# Patient Record
Sex: Female | Born: 1990 | Race: White | Hispanic: Yes | Marital: Single
Health system: Southern US, Community
[De-identification: ages and names within clinical notes are randomized; demographics above are authoritative.]

---
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
## 2012-04-04 IMAGING — US US OB DETAIL+14 WK
1 series · 14 of 28 positions shown · non-contrast
Comparison: none

[Series 1: us ob detail+14 wk · 14 of 55 slices shown]
[im 3/55]
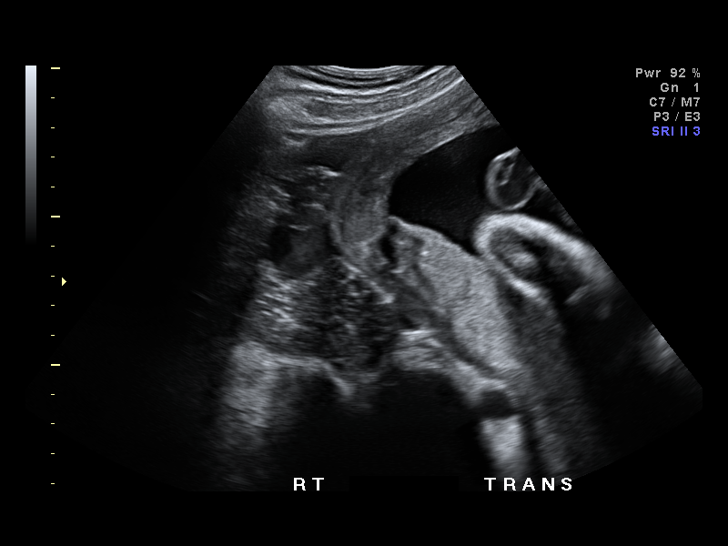
[im 7/55]
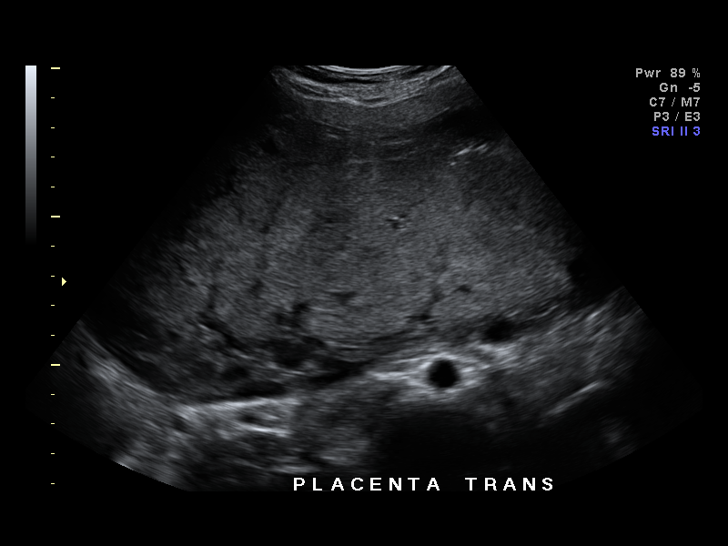
[im 11/55]
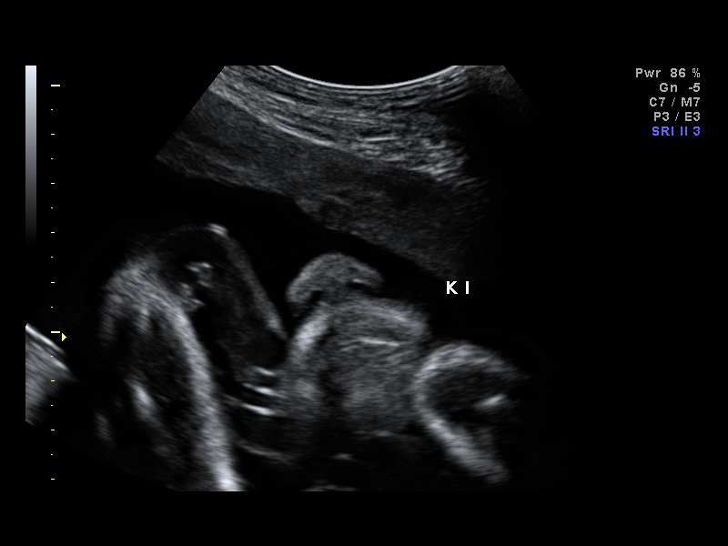
[im 15/55]
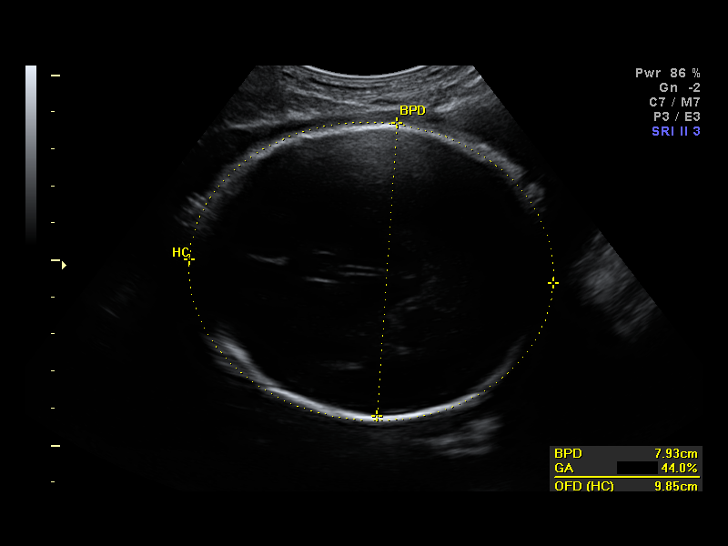
[im 19/55]
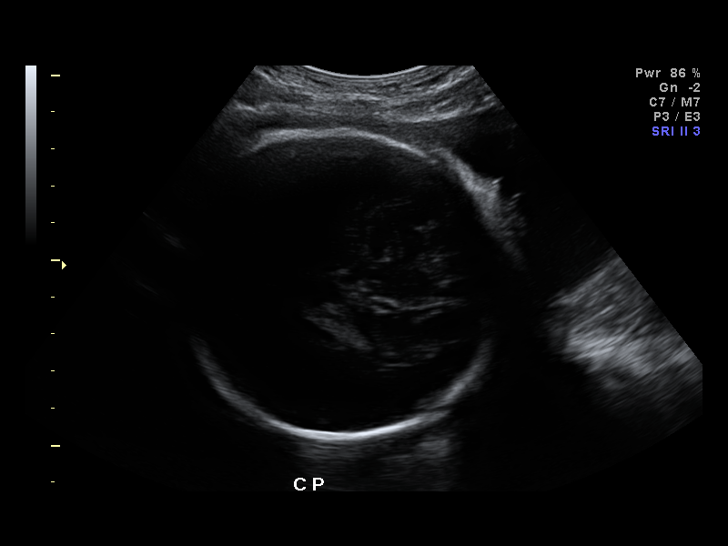
[im 23/55]
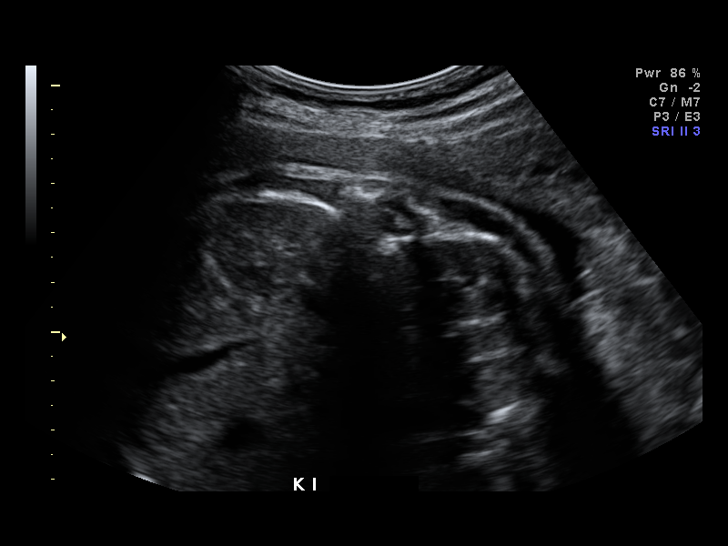
[im 27/55]
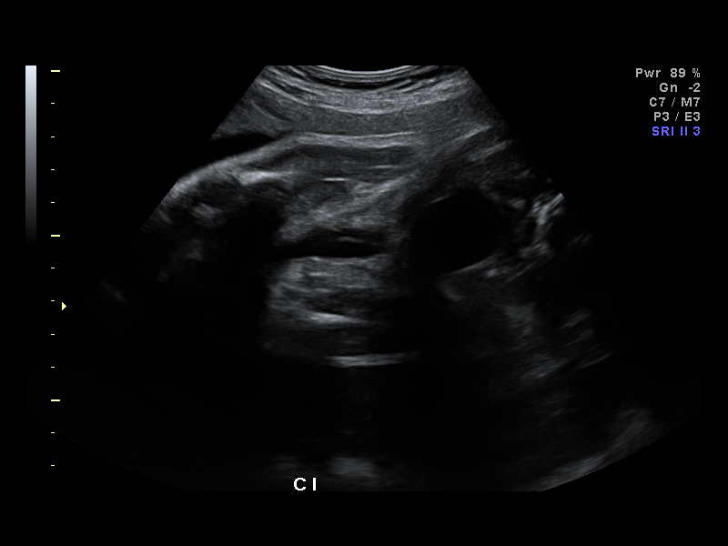
[im 31/55]
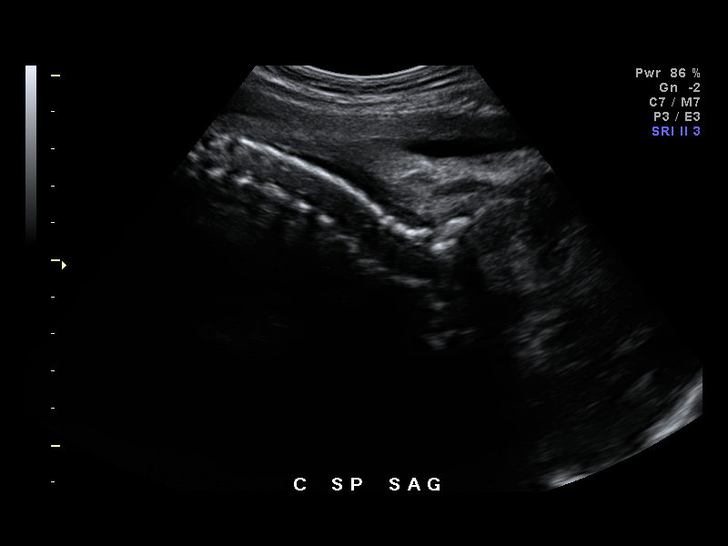
[im 35/55]
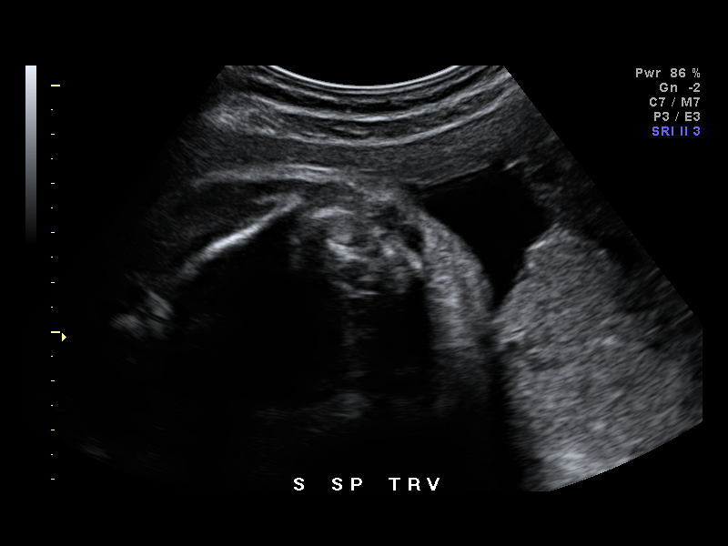
[im 39/55]
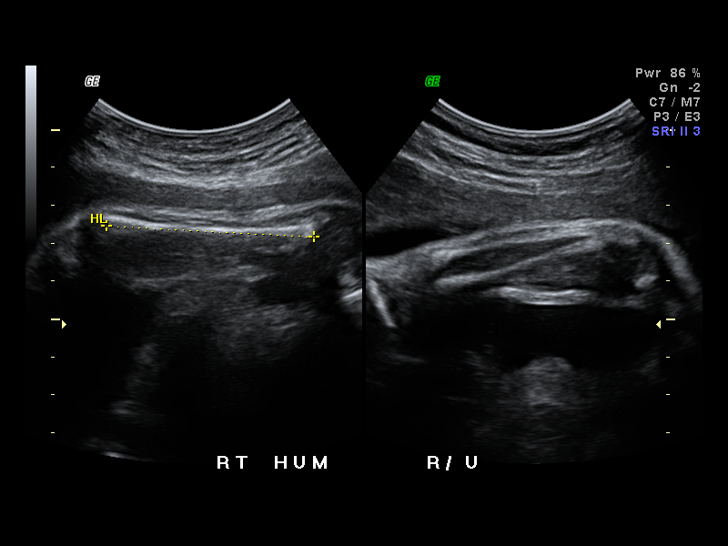
[im 43/55]
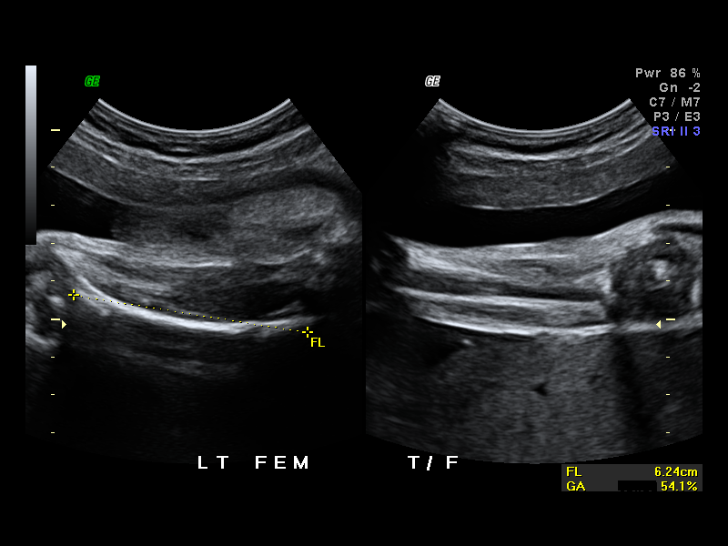
[im 47/55]
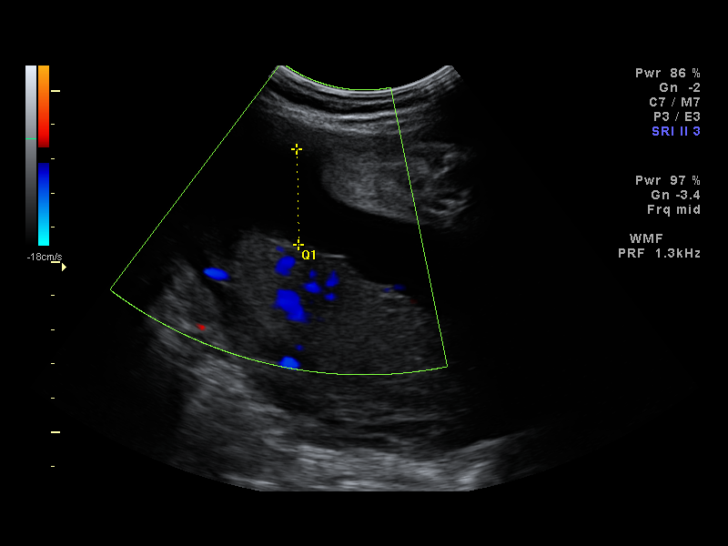
[im 51/55]
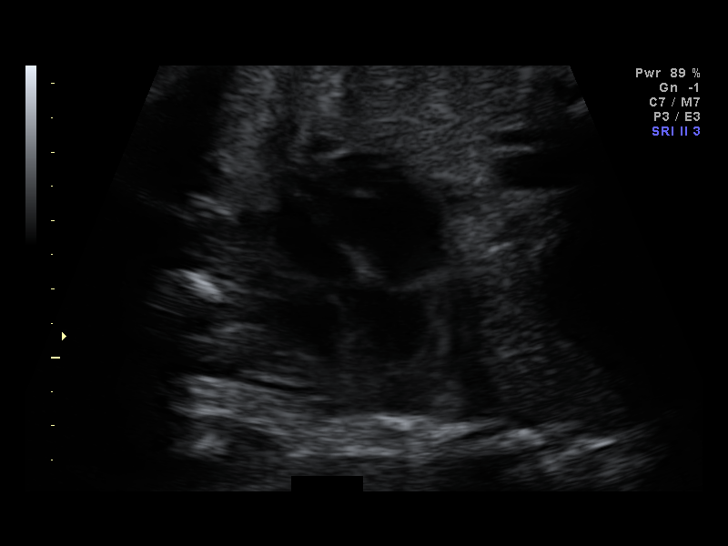
[im 55/55]
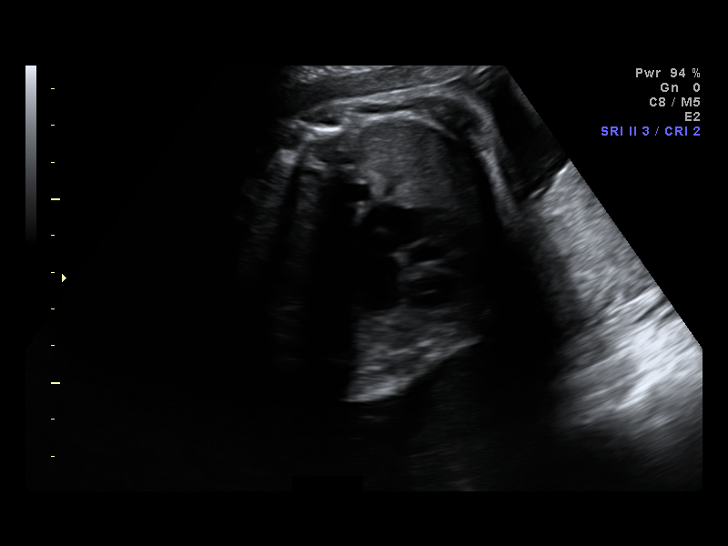

[14 of 28 positions shown; findings below may reference images not displayed]

Canned report from images found in remote index.

Refer to host system for actual result text.

## 2020-04-30 ENCOUNTER — Encounter (HOSPITAL_COMMUNITY): Payer: Self-pay | Admitting: Emergency Medicine

## 2020-04-30 ENCOUNTER — Emergency Department (HOSPITAL_COMMUNITY): Payer: Self-pay

## 2020-04-30 ENCOUNTER — Emergency Department (HOSPITAL_COMMUNITY)
Admission: EM | Admit: 2020-04-30 | Discharge: 2020-04-30 | Disposition: A | Discharge status: Home / Self Care | Payer: Self-pay | Attending: Emergency Medicine | Admitting: Emergency Medicine

## 2020-04-30 ENCOUNTER — Other Ambulatory Visit: Payer: Self-pay

## 2020-04-30 DIAGNOSIS — N83201 Unspecified ovarian cyst, right side: Secondary | ICD-10-CM | POA: Insufficient documentation

## 2020-04-30 DIAGNOSIS — N800 Endometriosis of uterus: Secondary | ICD-10-CM | POA: Insufficient documentation

## 2020-04-30 DIAGNOSIS — N8003 Adenomyosis of the uterus: Secondary | ICD-10-CM

## 2020-04-30 DIAGNOSIS — D270 Benign neoplasm of right ovary: Secondary | ICD-10-CM

## 2020-04-30 DIAGNOSIS — R102 Pelvic and perineal pain: Secondary | ICD-10-CM

## 2020-04-30 LAB — CBC
HCT: 32.8 % — ABNORMAL LOW (ref 36.0–46.0)
Hemoglobin: 10.5 g/dL — ABNORMAL LOW (ref 12.0–15.0)
MCH: 28.9 pg (ref 26.0–34.0)
MCHC: 32 g/dL (ref 30.0–36.0)
MCV: 90.4 fL (ref 80.0–100.0)
Platelets: 203 10*3/uL (ref 150–400)
RBC: 3.63 MIL/uL — ABNORMAL LOW (ref 3.87–5.11)
RDW: 13.2 % (ref 11.5–15.5)
WBC: 6.6 10*3/uL (ref 4.0–10.5)
nRBC: 0 % (ref 0.0–0.2)

## 2020-04-30 LAB — I-STAT BETA HCG BLOOD, ED (MC, WL, AP ONLY): I-stat hCG, quantitative: <5 m[IU]/mL (ref <5)

## 2020-04-30 LAB — WET PREP, GENITAL
Clue Cells Wet Prep HPF POC: NONE SEEN
Sperm: NONE SEEN
Trich, Wet Prep: NONE SEEN
Yeast Wet Prep HPF POC: NONE SEEN

## 2020-04-30 LAB — URINALYSIS, ROUTINE W REFLEX MICROSCOPIC
Bilirubin Urine: NEGATIVE
Glucose, UA: NEGATIVE mg/dL
Ketones, ur: NEGATIVE mg/dL
Leukocytes,Ua: NEGATIVE
Nitrite: NEGATIVE
Protein, ur: NEGATIVE mg/dL
Specific Gravity, Urine: 1.019 (ref 1.005–1.030)
pH: 6 (ref 5.0–8.0)

## 2020-04-30 LAB — COMPREHENSIVE METABOLIC PANEL
ALT: 11 U/L (ref 0–44)
AST: 22 U/L (ref 15–41)
Albumin: 3.8 g/dL (ref 3.5–5.0)
Alkaline Phosphatase: 34 U/L — ABNORMAL LOW (ref 38–126)
Anion gap: 10 (ref 5–15)
BUN: 13 mg/dL (ref 6–20)
CO2: 22 mmol/L (ref 22–32)
Calcium: 9 mg/dL (ref 8.9–10.3)
Chloride: 105 mmol/L (ref 98–111)
Creatinine, Ser: 0.79 mg/dL (ref 0.44–1.00)
GFR, Estimated: >60 mL/min (ref >60)
Glucose, Bld: 112 mg/dL — ABNORMAL HIGH (ref 70–99)
Potassium: 3.8 mmol/L (ref 3.5–5.1)
Sodium: 137 mmol/L (ref 135–145)
Total Bilirubin: 0.4 mg/dL (ref 0.3–1.2)
Total Protein: 7 g/dL (ref 6.5–8.1)

## 2020-04-30 LAB — LIPASE, BLOOD: Lipase: 27 U/L (ref 11–51)

## 2020-04-30 MED ORDER — HYDROCODONE-ACETAMINOPHEN 5-325 MG PO TABS: 2.0000 | ORAL_TABLET | ORAL | 0 refills | Status: AC | PRN

## 2020-04-30 MED ORDER — MORPHINE SULFATE (PF) 2 MG/ML IV SOLN
2.0000 mg | Freq: Once | INTRAVENOUS | Status: AC
  Administered 2020-04-30: 2 mg via INTRAVENOUS
  Filled 2020-04-30: qty 1

## 2020-04-30 MED ORDER — MORPHINE SULFATE (PF) 4 MG/ML IV SOLN: 4.0000 mg | Freq: Once | INTRAVENOUS | Status: DC

## 2020-04-30 NOTE — ED Provider Notes (Signed)
[facility] EM[facility] DEPARTMENT Provider Note   CSN: [id number] Arrival date & time: 04/30/20  1818     History Chief Complaint  Patient presents with  . Abdominal Pain    [name] is a 29 y.o. female.  HPI 29 year-old female with no significant medical history resents to the ER with complaints of suprapubic abdominal pain which started this morning.  Patient states that she started her menstrual cycle, sharp suprapubic pain which has gotten worse throughout the day.  She has taken a regular dose of ibuprofen with relief.  Feels like maybe her body is "rejecting ibuprofen" she does not take much medicine.  She states that she has had pain with laying down and even walking.  She denies any nausea or vomiting.  No fevers or chills.  She is currently on her menstrual cycle and has bleeding.  She states that this is happened once before, and she had a miscarriage.  She is sexually active with one partner and they do not use the barrier method.  She is not on any birth control.  She denies any flank pain.  No dysuria.  She does still have her appendix.  Has felt nauseous but no vomiting, has had some loose stools but no diarrhea.      History reviewed. No pertinent past medical history.  There are no problems to display for this patient.   History reviewed. No pertinent surgical history.   OB History   No obstetric history on file.     History reviewed. No pertinent family history.     Home Medications Prior to Admission medications   Medication Sig Start Date End Date Taking? Authorizing Provider  ibuprofen (ADVIL) 800 MG tablet Take 200 mg by mouth every 6 (six) hours as needed for headache or moderate pain.   Yes [provider]  Multiple Vitamins-Minerals (HAIR SKIN AND NAILS FORMULA PO) Take 1 tablet by mouth daily.   Yes [provider]  Omega-3 1000 MG CAPS Take 1,000 mg by mouth daily.   Yes [provider]  OVER THE  COUNTER MEDICATION Take 2 tablets by mouth daily. Vitamin d, zinc and magnesium   Yes [provider]  HYDROcodone-acetaminophen (NORCO/VICODIN) 5-325 MG tablet Take 2 tablets by mouth every 4 (four) hours as needed for up to 3 days. 04/30/20 05/03/20  Garald Balding, PA-C    Allergies    Patient has no known allergies.  Review of Systems   Review of Systems  Constitutional: Negative for chills and fever.  HENT: Negative for ear pain and sore throat.   Eyes: Negative for pain and visual disturbance.  Respiratory: Negative for cough and shortness of breath.   Cardiovascular: Negative for chest pain and palpitations.  Gastrointestinal: Positive for abdominal pain. Negative for vomiting.  Genitourinary: Positive for pelvic pain and vaginal bleeding. Negative for decreased urine volume, dysuria and hematuria.  Musculoskeletal: Negative for arthralgias and back pain.  Skin: Negative for color change and rash.  Neurological: Negative for seizures and syncope.  All other systems reviewed and are negative.   Physical Exam Updated Vital Signs BP 104/78 (BP Location: Right Arm)   Pulse 88   Temp 98.5 F (36.9 C) (Oral)   Resp 16   SpO2 100%   Physical Exam Vitals reviewed. Exam conducted with a chaperone present.  Constitutional:      Appearance: Normal appearance.  HENT:     Head: Normocephalic and atraumatic.  Eyes:  General:        Right eye: No discharge.        Left eye: No discharge.     Extraocular Movements: Extraocular movements intact.     Conjunctiva/sclera: Conjunctivae normal.  Cardiovascular:     Rate and Rhythm: Normal rate.     Pulses: Normal pulses.     Heart sounds: Normal heart sounds.  Pulmonary:     Effort: Pulmonary effort is normal.     Breath sounds: Normal breath sounds.  Abdominal:     Tenderness: There is abdominal tenderness in the right lower quadrant and left lower quadrant. There is guarding. There is no right CVA tenderness or left  CVA tenderness.     Comments: Right worse than left lower quadrant tenderness  Genitourinary:    Vagina: Bleeding present.     Cervix: Cervical bleeding present.     Uterus: Normal.      Adnexa:        Right: Tenderness present.        Left: Tenderness present.      Rectum: Normal.     Comments: Copious amount blood with clots.  No cervical motion tenderness. Right and left adnexal tenderness, right worse than left  Musculoskeletal:        General: No swelling. Normal range of motion.  Skin:    General: Skin is warm and dry.  Neurological:     General: No focal deficit present.     Mental Status: She is alert and oriented to person, place, and time.  Psychiatric:        Mood and Affect: Mood normal.        Behavior: Behavior normal.     ED Results / Procedures / Treatments   Labs (all labs ordered are listed, but only abnormal results are displayed) Labs Reviewed  WET PREP, GENITAL - Abnormal; Notable for the following components:      Result Value   WBC, Wet Prep HPF POC MANY (*)    All other components within normal limits  COMPREHENSIVE METABOLIC PANEL - Abnormal; Notable for the following components:   Glucose, Bld 112 (*)    Alkaline Phosphatase 34 (*)    All other components within normal limits  CBC - Abnormal; Notable for the following components:   RBC 3.63 (*)    Hemoglobin 10.5 (*)    HCT 32.8 (*)    All other components within normal limits  URINALYSIS, ROUTINE W REFLEX MICROSCOPIC - Abnormal; Notable for the following components:   Hgb urine dipstick SMALL (*)    Bacteria, UA RARE (*)    All other components within normal limits  LIPASE, BLOOD  I-STAT BETA HCG BLOOD, ED ([facility], [facility], [facility] ONLY)  GC/CHLAMYDIA PROBE AMP ([facility]) NOT AT [facility]    EKG None  Radiology US PELVIC COMPLETE W TRANSVAGINAL AND TORSION R/O  Result Date: 04/30/2020 CLINICAL DATA:  Initial evaluation for acute pelvic pain. EXAM: TRANSABDOMINAL AND TRANSVAGINAL ULTRASOUND OF PELVIS  DOPPLER ULTRASOUND OF OVARIES TECHNIQUE: Both transabdominal and transvaginal ultrasound examinations of the pelvis were performed. Transabdominal technique was performed for global imaging of the pelvis including uterus, ovaries, adnexal regions, and pelvic cul-de-sac. It was necessary to proceed with endovaginal exam following the transabdominal exam to visualize the uterus, endometrium, and ovaries. Color and duplex Doppler ultrasound was utilized to evaluate blood flow to the ovaries. COMPARISON:  None available. FINDINGS: Uterus Measurements: 12.2 x 6.2 x 7.7 cm = volume: 31.6 mL. Uterus is anteverted.  Uterus is somewhat diffusely enlarged with heterogeneous echotexture, poor endometrial/myometrial interface, and scattered echogenic striations, suggesting possible adenomyosis. Possible superimposed 2.1 x 2.0 x 1.8 cm intramural fibroid at the right uterine fundus. Endometrium Thickness: 4.7 mm.  No focal abnormality visualized. Right ovary Measurements: 3.6 x 1.3 x 2.6 cm = volume: 6.3 mL. Mixed solid and cystic mass seen within the right adnexa, likely ovarian in origin. Lesion measures 4.3 x 3.3 x 4.5 cm with internal hyperechoic echotexture and posterior shadowing. Finding favored to reflect an ovarian dermoid. Left ovary Measurements: 3.8 x 2.0 x 2.3 cm = volume: 9.1 mL. Note made of a 4 mm echogenic focus within the left ovary, likely a small calcification, of doubtful significance. No other adnexal mass. Pulsed Doppler evaluation of both ovaries demonstrates normal low-resistance arterial and venous waveforms. Other findings Trace free fluid within the pelvis. IMPRESSION: 1. 4.5 cm mixed solid and cystic right adnexal mass, nonspecific, but favored to reflect an ovarian dermoid. Further evaluation with dedicated pelvic MRI, with and without contrast, suggested for further evaluation. 2. No evidence for ovarian torsion. 3. Suspected uterine adenomyosis, with possible superimposed 2.1 cm intramural fibroid  at the right uterine fundus. Electronically Signed   By: Jeannine Boga M.D.   On: 04/30/2020 21:31    Procedures Procedures (including critical care time)  Medications Ordered in ED Medications  morphine 2 MG/ML injection 2 mg (2 mg Intravenous Given 04/30/20 1917)  morphine 2 MG/ML injection 2 mg (2 mg Intravenous Given 04/30/20 2015)    ED Course  I have reviewed the triage vital signs and the nursing notes.  Pertinent labs & imaging results that were available during my care of the patient were reviewed by me and considered in my medical decision making (see chart for details).    MDM Rules/Calculators/A&P                         29 year old female with complaints of suprapubic pain in the setting of her menstrual cycle.  Noted sharp and intense pain which started this morning.  No vomiting, but some nausea.  Has had heavy bleeding which is normal for her.  Cycles normally lasts about 6 to 7 days.  Physical exam with bilateral adnexal tenderness and guarding.  No peritoneal signs.  No cervical motion tenderness.  Low suspicion for PID.  No flank tenderness.  Vitals overall reassuring, afebrile.  CMP and CBC largely unremarkable, with some evidence of anemia with a hemoglobin of 10.5.  UA with evidence of blood and rare bacteria, but no convincing evidence of UTI.  Pregnancy is negative.  Wet prep with many WBCs, no clue cells, trichomoniasis, yeast.  Gonorrhea and chlamydia pending.  Given adnexal tenderness on exam, pelvic ultrasound was ordered.  This showed a right 4.6 cm dermoid cyst, as well as evidence of adenomyosis.  There is also another fibroid noted at the right uterine fundus.  She received 4 mg of morphine with no significant improvement in pain.  Discussed the findings with Dr. Elgie Congo with OB/GYN, he recommends outpatient follow-up for further evaluation.  Low suspicion for appendicitis, diverticulitis, suspect findings on pelvic ultrasound are the cause of her pain.   Patient was encouraged to take 800 mg of ibuprofen 3 times daily for pain as needed, will send several days worth of Norco for breakthrough pain.  PMP checked and is appropriate.  Patient states that she lives in [place] and is visiting here for 3 weeks.  I gave her OB/GYN follow-up here in [place] if needed, however encouraged also to follow-up with an OB/GYN in [place].  Precautions discussed.  She voiced understanding is agreeable.  At this stage in the ED course, the patient is medically screened and stable for discharge  Case discussed with supervising physician Dr. Tyrone Nine who is agreeable to the plan disposition  Final Clinical Impression(s) / ED Diagnoses Final diagnoses:  Pelvic pain  Adenomyosis  Cyst, ovary, dermoid, right    Rx / DC Orders ED Discharge Orders         Ordered    HYDROcodone-acetaminophen (NORCO/VICODIN) 5-325 MG tablet  Every 4 hours PRN        04/30/20 2149           Garald Balding, PA-C 04/30/20 2159    Deno Etienne, DO 04/30/20 2203

## 2020-04-30 NOTE — Discharge Instructions (Signed)
Please see the results of your pelvic ultrasound in the MyChart app. There is information on your discharge paperwork on how to download this app.  It showed a possible dermoid cyst on the right ovary, which is a saclike growth that can occur in the ovary.  It also showed adenomyosis to the growth of the tissue that normalized the uterus into the surrounding muscular wall of the uterus.  These are not life-threatening, but can cause pain.  Please make sure to follow-up with an OB/GYN.  I have provided information for follow-up for an OB/GYN here in the [place] area.  Please take 800 mg of ibuprofen 3 times daily as needed for pain.  You may use the Norco if your pain is poorly controlled with ibuprofen.  Please return to the ER for any new or worsening symptoms.

## 2020-04-30 NOTE — ED Notes (Signed)
Patient transported to Ultrasound 

## 2020-04-30 NOTE — ED Triage Notes (Signed)
Pt arrives to ED with c/o suprapubic abdominal pain that started this morning. Pt states throughput the day the pain worsens. Was dull, now sharp pain. Pt states she's currently on her period. Denies dysuria, fevers, SOB, or chills.

## 2020-05-03 LAB — GC/CHLAMYDIA PROBE AMP (~~LOC~~) NOT AT ARMC
Chlamydia: NEGATIVE
Comment: NEGATIVE
Comment: NORMAL
Neisseria Gonorrhea: NEGATIVE

## 2022-03-30 IMAGING — US US PELVIS COMPLETE TRANSABD/TRANSVAG W DUPLEX
1 series · 13 of 25 positions shown · non-contrast
Comparison: None available.

CLINICAL DATA: Initial evaluation for acute pelvic pain.

EXAM:
TRANSABDOMINAL AND TRANSVAGINAL ULTRASOUND OF PELVIS
DOPPLER ULTRASOUND OF OVARIES
TECHNIQUE: Both transabdominal and transvaginal ultrasound examinations of the
pelvis were performed. Transabdominal technique was performed for
global imaging of the pelvis including uterus, ovaries, adnexal
regions, and pelvic cul-de-sac.
It was necessary to proceed with endovaginal exam following the
transabdominal exam to visualize the uterus, endometrium, and
ovaries. Color and duplex Doppler ultrasound was utilized to
evaluate blood flow to the ovaries.

[Series 1: us pelvic complete w transvaginal and torsion righ · 158 acquisitions, 13 frames shown]
[im 1/158]
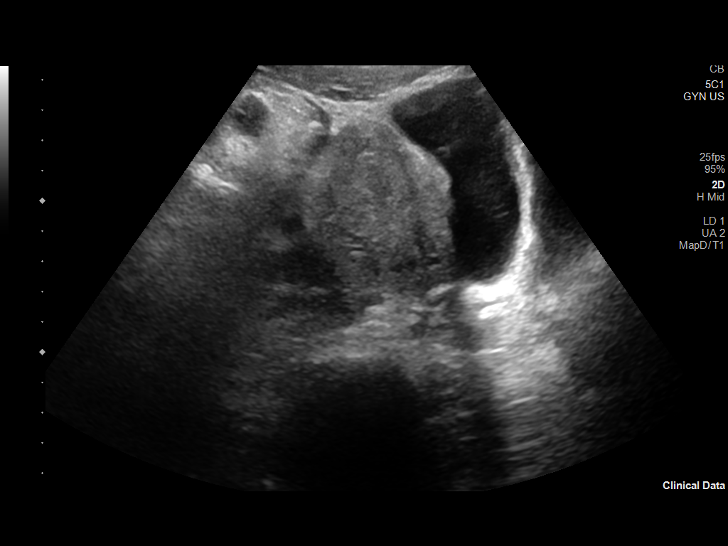
[im 14/158]
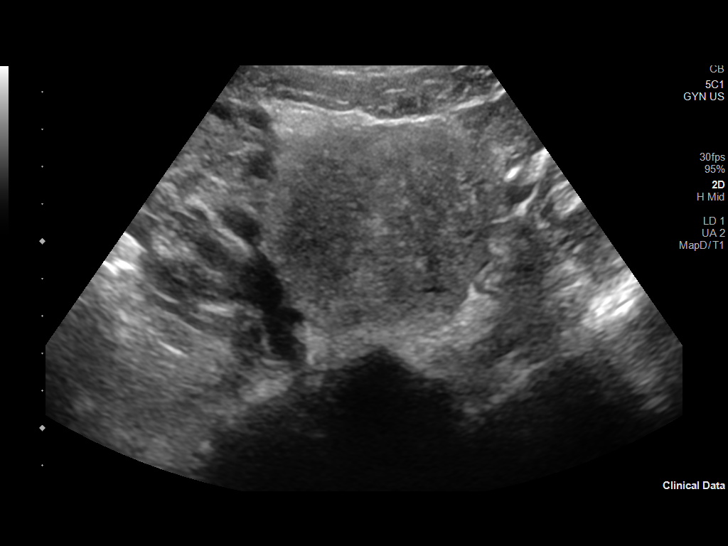
[im 27/158]
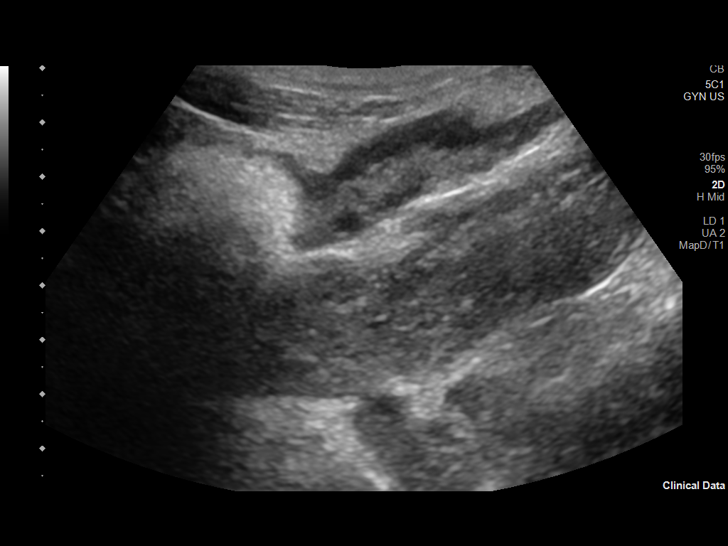
[im 40/158]
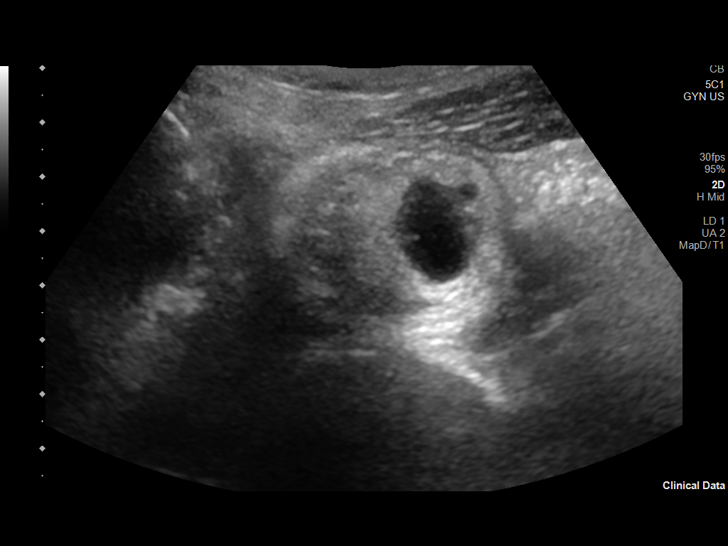
[im 53/158]
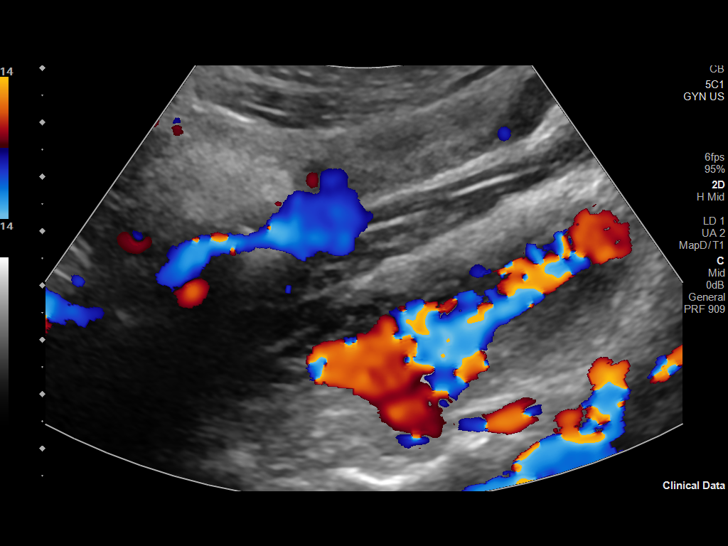
[im 66/158]
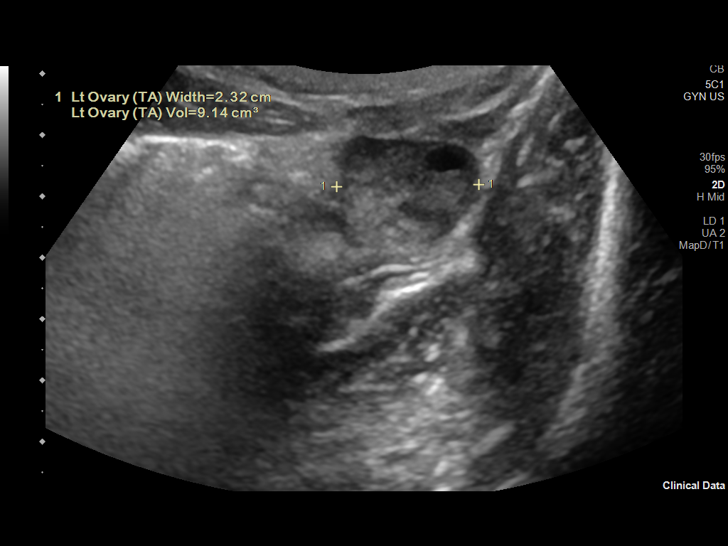
[im 79/158]
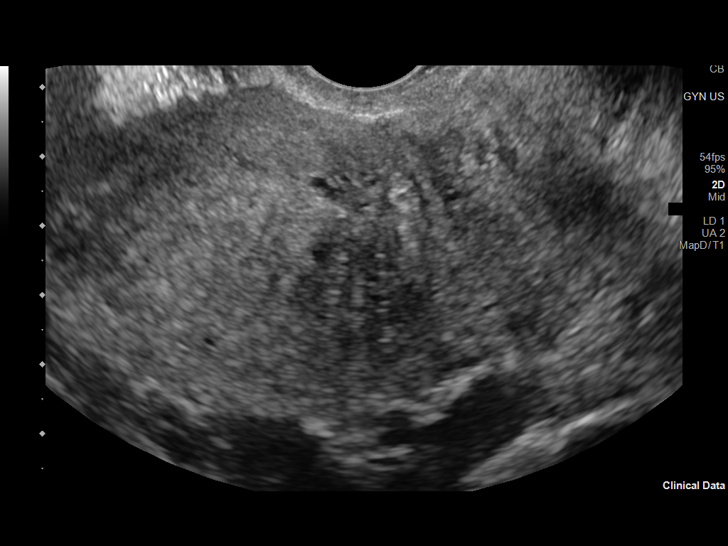
[im 92/158]
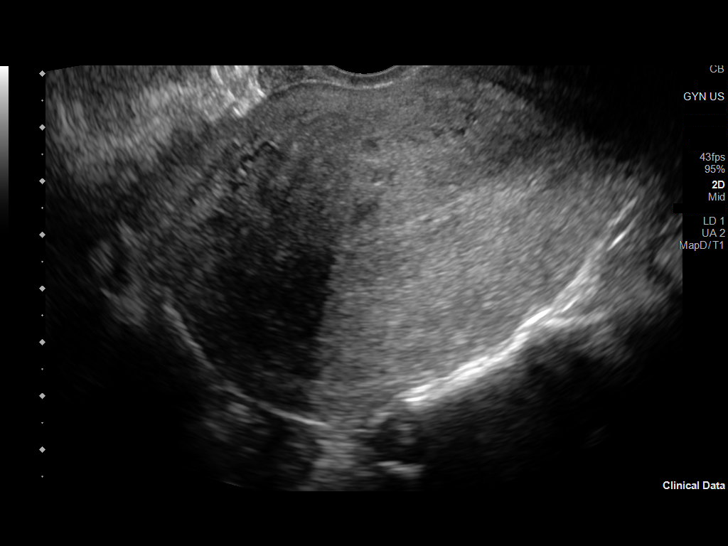
[im 105/158]
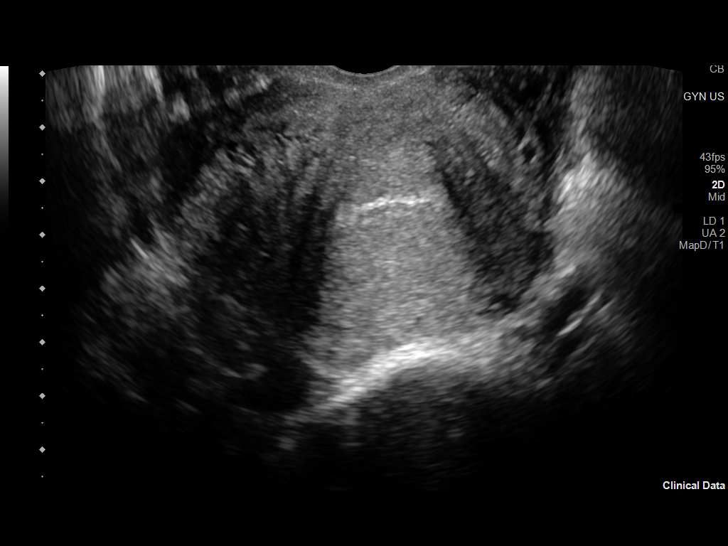
[im 118/158]
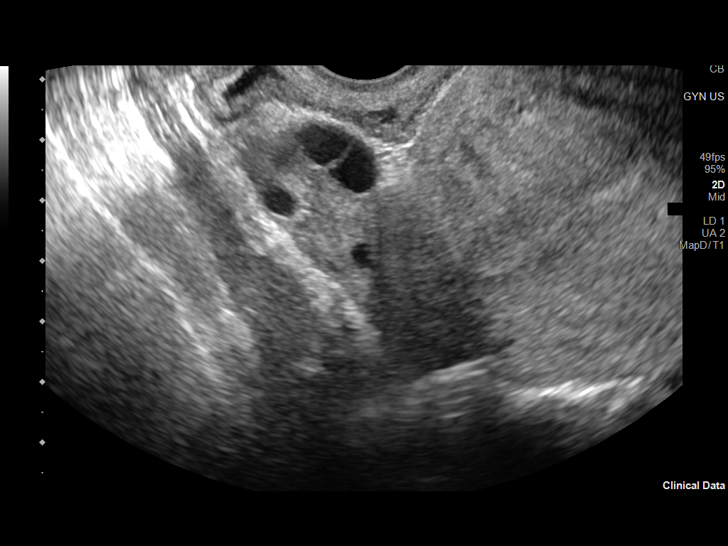
[im 131/158]
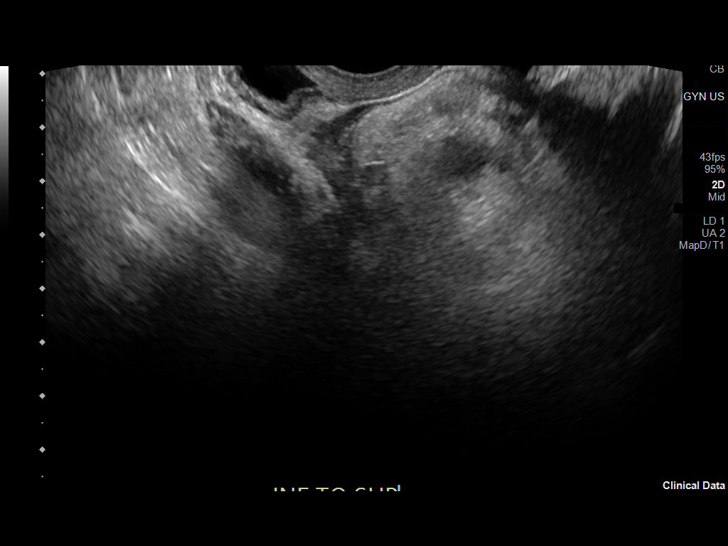
[im 144/158]
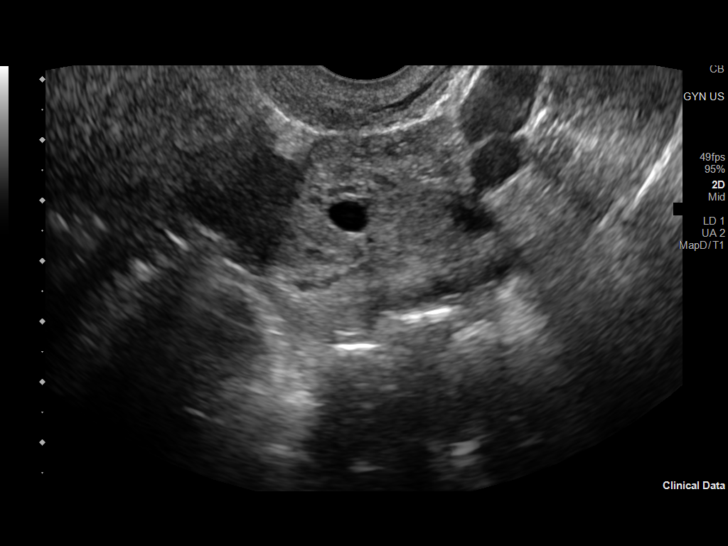
[im 158/158]
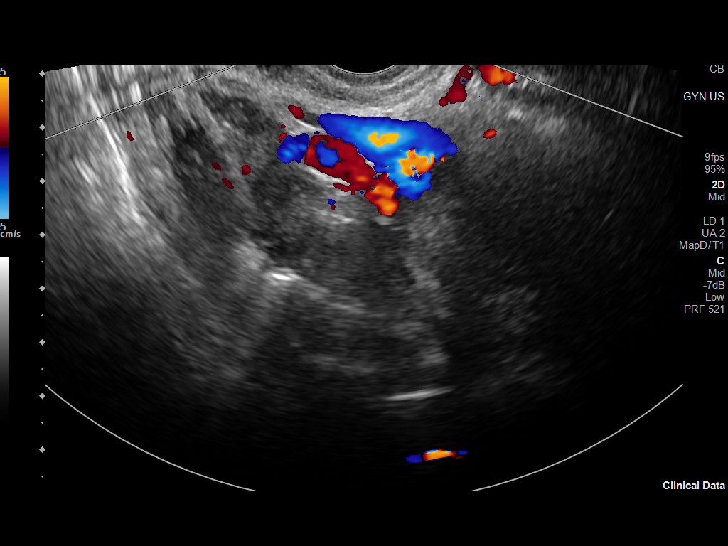

[13 of 25 positions shown; findings below may reference images not displayed]

FINDINGS: Uterus

Measurements: 12.2 x 6.2 x 7.7 cm = volume: 31.6 mL. Uterus is
anteverted. Uterus is somewhat diffusely enlarged with heterogeneous
echotexture, poor endometrial/myometrial interface, and scattered
echogenic striations, suggesting possible adenomyosis. Possible
superimposed 2.1 x 2.0 x 1.8 cm intramural fibroid at the right
uterine fundus.

Endometrium

Thickness: 4.7 mm.  No focal abnormality visualized.

Right ovary

Measurements: 3.6 x 1.3 x 2.6 cm = volume: 6.3 mL. Mixed solid and
cystic mass seen within the right adnexa, likely ovarian in origin.
Lesion measures 4.3 x 3.3 x 4.5 cm with internal hyperechoic
echotexture and posterior shadowing. Finding favored to reflect an
ovarian dermoid.

Left ovary

Measurements: 3.8 x 2.0 x 2.3 cm = volume: 9.1 mL. Note made of a 4
mm echogenic focus within the left ovary, likely a small
calcification, of doubtful significance. No other adnexal mass.

Pulsed Doppler evaluation of both ovaries demonstrates normal
low-resistance arterial and venous waveforms.

Other findings

Trace free fluid within the pelvis.
IMPRESSION: 1. 4.5 cm mixed solid and cystic right adnexal mass, nonspecific,
but favored to reflect an ovarian dermoid. Further evaluation with
dedicated pelvic MRI, with and without contrast, suggested for
further evaluation.
2. No evidence for ovarian torsion.
3. Suspected uterine adenomyosis, with possible superimposed 2.1 cm
intramural fibroid at the right uterine fundus.

## 2023-11-23 ENCOUNTER — Encounter: Payer: Self-pay | Admitting: Advanced Practice Midwife

## 2023-12-11 ENCOUNTER — Ambulatory Visit (HOSPITAL_BASED_OUTPATIENT_CLINIC_OR_DEPARTMENT_OTHER): Payer: Self-pay | Admitting: Family Medicine
